# Patient Record
Sex: Male | Born: 1955 | Race: White | Hispanic: No | Marital: Married | Smoking: Former smoker
Health system: Southern US, Community
[De-identification: ages and names within clinical notes are randomized; demographics above are authoritative.]

## PROBLEM LIST (undated history)

## (undated) DIAGNOSIS — F419 Anxiety disorder, unspecified: Secondary | ICD-10-CM

## (undated) DIAGNOSIS — M1990 Unspecified osteoarthritis, unspecified site: Secondary | ICD-10-CM

## (undated) DIAGNOSIS — H409 Unspecified glaucoma: Secondary | ICD-10-CM

## (undated) DIAGNOSIS — E7800 Pure hypercholesterolemia, unspecified: Secondary | ICD-10-CM

## (undated) DIAGNOSIS — G1221 Amyotrophic lateral sclerosis: Secondary | ICD-10-CM

## (undated) DIAGNOSIS — K635 Polyp of colon: Secondary | ICD-10-CM

## (undated) DIAGNOSIS — I10 Essential (primary) hypertension: Secondary | ICD-10-CM

## (undated) DIAGNOSIS — K219 Gastro-esophageal reflux disease without esophagitis: Secondary | ICD-10-CM

## (undated) HISTORY — DX: Anxiety disorder, unspecified: F41.9

## (undated) HISTORY — DX: Unspecified osteoarthritis, unspecified site: M19.90

## (undated) HISTORY — PX: GASTRECTOMY: SHX58

## (undated) HISTORY — DX: Pure hypercholesterolemia, unspecified: E78.00

## (undated) HISTORY — DX: Essential (primary) hypertension: I10

## (undated) HISTORY — PX: COLONOSCOPY: SHX174

## (undated) HISTORY — DX: Polyp of colon: K63.5

## (undated) HISTORY — DX: Gastro-esophageal reflux disease without esophagitis: K21.9

## (undated) HISTORY — DX: Unspecified glaucoma: H40.9

---

## 1998-08-12 ENCOUNTER — Emergency Department (HOSPITAL_COMMUNITY): Admission: EM | Admit: 1998-08-12 | Discharge: 1998-08-12 | Payer: Self-pay | Admitting: Emergency Medicine

## 1998-09-19 ENCOUNTER — Encounter: Payer: Self-pay | Admitting: Emergency Medicine

## 1998-09-19 ENCOUNTER — Emergency Department (HOSPITAL_COMMUNITY): Admission: EM | Admit: 1998-09-19 | Discharge: 1998-09-19 | Payer: Self-pay | Admitting: Emergency Medicine

## 2003-11-11 ENCOUNTER — Observation Stay (HOSPITAL_COMMUNITY): Admission: EM | Admit: 2003-11-11 | Discharge: 2003-11-12 | Payer: Self-pay | Admitting: Emergency Medicine

## 2004-03-09 HISTORY — PX: OTHER SURGICAL HISTORY: SHX169

## 2004-11-17 ENCOUNTER — Emergency Department (HOSPITAL_COMMUNITY): Admission: EM | Admit: 2004-11-17 | Discharge: 2004-11-17 | Payer: Self-pay | Admitting: Emergency Medicine

## 2011-07-02 ENCOUNTER — Ambulatory Visit: Payer: Self-pay | Admitting: Family

## 2011-07-14 ENCOUNTER — Ambulatory Visit: Payer: Self-pay | Admitting: Family

## 2011-07-14 LAB — CREATININE, SERUM
Creatinine: 0.99 mg/dL (ref 0.60–1.30)
EGFR (African American): >60
EGFR (Non-African Amer.): >60

## 2012-12-13 ENCOUNTER — Encounter: Payer: Self-pay | Admitting: Internal Medicine

## 2012-12-21 ENCOUNTER — Ambulatory Visit (AMBULATORY_SURGERY_CENTER): Payer: BC Managed Care – PPO

## 2012-12-21 VITALS — Ht 69.5 in | Wt 170.0 lb

## 2012-12-21 DIAGNOSIS — Z1211 Encounter for screening for malignant neoplasm of colon: Secondary | ICD-10-CM

## 2012-12-21 MED ORDER — MOVIPREP 100 G PO SOLR: 1.0000 | Freq: Once | ORAL | Status: DC

## 2012-12-22 ENCOUNTER — Encounter: Payer: Self-pay | Admitting: Internal Medicine

## 2013-01-02 ENCOUNTER — Encounter: Payer: Self-pay | Admitting: Internal Medicine

## 2013-02-07 NOTE — Addendum Note (Signed)
Addended by: Maple Hudson on: 02/07/2013 12:25 PM   Modules accepted: Level of Service

## 2013-04-03 ENCOUNTER — Telehealth: Payer: Self-pay | Admitting: Internal Medicine

## 2013-04-03 NOTE — Telephone Encounter (Signed)
Rx for MoviPrep called in to [facility].  Pt's wife notified.

## 2013-04-06 ENCOUNTER — Ambulatory Visit (AMBULATORY_SURGERY_CENTER): Payer: BC Managed Care – PPO | Admitting: Internal Medicine

## 2013-04-06 ENCOUNTER — Encounter: Payer: Self-pay | Admitting: Internal Medicine

## 2013-04-06 VITALS — BP 124/76 | HR 60 | Temp 96.0°F | Resp 38 | Ht 69.5 in | Wt 170.0 lb

## 2013-04-06 DIAGNOSIS — D126 Benign neoplasm of colon, unspecified: Secondary | ICD-10-CM

## 2013-04-06 DIAGNOSIS — Z1211 Encounter for screening for malignant neoplasm of colon: Secondary | ICD-10-CM

## 2013-04-06 MED ORDER — SODIUM CHLORIDE 0.9 % IV SOLN: 500.0000 mL | INTRAVENOUS | Status: DC

## 2013-04-06 NOTE — Patient Instructions (Signed)
YOU HAD AN ENDOSCOPIC PROCEDURE TODAY AT THE [facility]: Refer to the procedure report that was given to you for any specific questions about what was found during the examination.  If the procedure report does not answer your questions, please call your gastroenterologist to clarify.  If you requested that your care partner not be given the details of your procedure findings, then the procedure report has been included in a sealed envelope for you to review at your convenience later.  YOU SHOULD EXPECT: Some feelings of bloating in the abdomen. Passage of more gas than usual.  Walking can help get rid of the air that was put into your GI tract during the procedure and reduce the bloating. If you had a lower endoscopy (such as a colonoscopy or flexible sigmoidoscopy) you may notice spotting of blood in your stool or on the toilet paper. If you underwent a bowel prep for your procedure, then you may not have a normal bowel movement for a few days.  DIET: Your first meal following the procedure should be a light meal and then it is ok to progress to your normal diet.  A half-sandwich or bowl of soup is an example of a good first meal.  Heavy or fried foods are harder to digest and may make you feel nauseous or bloated.  Likewise meals heavy in dairy and vegetables can cause extra gas to form and this can also increase the bloating.  Drink plenty of fluids but you should avoid alcoholic beverages for 24 hours.  ACTIVITY: Your care partner should take you home directly after the procedure.  You should plan to take it easy, moving slowly for the rest of the day.  You can resume normal activity the day after the procedure however you should NOT DRIVE or use heavy machinery for 24 hours (because of the sedation medicines used during the test).    SYMPTOMS TO REPORT IMMEDIATELY: A gastroenterologist can be reached at any hour.  During normal business hours, 8:30 AM to 5:00 PM Monday through Friday,  call [phone].  After hours and on weekends, please call the GI answering service at [phone] who will take a message and have the physician on call contact you.   Following lower endoscopy (colonoscopy or flexible sigmoidoscopy):  Excessive amounts of blood in the stool  Significant tenderness or worsening of abdominal pains  Swelling of the abdomen that is new, acute  Fever of 100F or higher  FOLLOW UP: If any biopsies were taken you will be contacted by phone or by letter within the next 1-3 weeks.  Call your gastroenterologist if you have not heard about the biopsies in 3 weeks.  Our staff will call the home number listed on your records the next business day following your procedure to check on you and address any questions or concerns that you may have at that time regarding the information given to you following your procedure. This is a courtesy call and so if there is no answer at the home number and we have not heard from you through the emergency physician on call, we will assume that you have returned to your regular daily activities without incident.  SIGNATURES/CONFIDENTIALITY: You and/or your care partner have signed paperwork which will be entered into your electronic medical record.  These signatures attest to the fact that that the information above on your After Visit Summary has been reviewed and is understood.  Full responsibility of the confidentiality of this   discharge information lies with you and/or your care-partner.  Please hold aspirin, all aspirin products, and all anti inflammatory medicines like motrin, advil, aleve, goody;s for 1 week. You may take tylenol only for pain. Resume these other medicines on April 13, 2013.  Handout on polyps

## 2013-04-06 NOTE — Op Note (Signed)
[place]  [address] [place], [id number]   COLONOSCOPY PROCEDURE REPORT  PATIENT: Anthony, Marquez  MR#: [id number] BIRTHDATE: December 19, 1955 , 57  yrs. old GENDER: Male ENDOSCOPIST: Jerene Bears, MD REFERRED PP:IRJJOA Wells, N.P. PROCEDURE DATE:  04/06/2013 PROCEDURE:   Colonoscopy with snare polypectomy and Colonoscopy with cold biopsy polypectomy First Screening Colonoscopy - Avg.  risk and is 50 yrs.  old or older Yes.  Prior Negative Screening - Now for repeat screening. N/A  History of Adenoma - Now for follow-up colonoscopy & has been > or = to 3 yrs.  N/A  Polyps Removed Today? Yes. ASA CLASS:   Class II INDICATIONS:average risk screening and first colonoscopy. MEDICATIONS: MAC sedation, administered by CRNA and propofol (Diprivan) 350mg  IV  DESCRIPTION OF PROCEDURE:   After the risks benefits and alternatives of the procedure were thoroughly explained, informed consent was obtained.  A digital rectal exam revealed no rectal mass.   The LB [id number]  endoscope was introduced through the anus and advanced to the cecum, which was identified by both the appendix and ileocecal valve. No adverse events experienced. The quality of the prep was good, using MoviPrep  The instrument was then slowly withdrawn as the colon was fully examined.  COLON FINDINGS: Four sessile polyps measuring 4-7 mm in size were found in the ascending colon, transverse colon (2), and sigmoid colon.  Polypectomy was performed with cold forceps (1) and using cold snare (3).  All resections were complete and all polyp tissue was completely retrieved.   A sessile polyp measuring 5 mm in size was found in the rectosigmoid colon.  A polypectomy was performed with a cold snare.  The resection was complete and the polyp tissue was completely retrieved.  Retroflexed views revealed internal hemorrhoids. The time to cecum=2 minutes 59 seconds.  Withdrawal time=21 minutes 18 seconds.   The scope was withdrawn and the procedure completed. COMPLICATIONS: There were no complications.  ENDOSCOPIC IMPRESSION: 1.   Four sessile polyps measuring 4-7 mm in size were found in the ascending colon, transverse colon, and sigmoid colon; Polypectomy was performed with cold forceps and using cold snare 2.   Sessile polyp measuring 5 mm in size was found in the rectosigmoid colon; polypectomy was performed with a cold snare  RECOMMENDATIONS: 1.  Hold aspirin, aspirin products, and anti-inflammatory medication for 1 week. 2.  Await pathology results 3.  Timing of repeat colonoscopy will be determined by pathology findings. 4.  You will receive a letter within 1-2 weeks with the results of your biopsy as well as final recommendations.  Please call my office if you have not received a letter after 3 weeks.   eSigned:  Jerene Bears, MD 04/06/2013 9:42 AM cc: The Patient; Suzan Garibaldi, N.P.

## 2013-04-06 NOTE — Progress Notes (Signed)
Patient did not experience any of the following events: a burn prior to discharge; a fall within the facility; wrong site/side/patient/procedure/implant event; or a hospital transfer or hospital admission upon discharge from the facility. (G8907) Patient did not have preoperative order for IV antibiotic SSI prophylaxis. (G8918)  

## 2013-04-06 NOTE — Progress Notes (Signed)
Called to room to assist during endoscopic procedure.  Patient ID and intended procedure confirmed with present staff. Received instructions for my participation in the procedure from the performing physician.  

## 2013-04-07 ENCOUNTER — Telehealth: Payer: Self-pay

## 2013-04-07 NOTE — Telephone Encounter (Signed)
Left message on answering machine. 

## 2013-04-12 ENCOUNTER — Encounter: Payer: Self-pay | Admitting: Internal Medicine

## 2013-12-18 ENCOUNTER — Encounter: Payer: Self-pay | Admitting: Internal Medicine

## 2014-01-29 ENCOUNTER — Ambulatory Visit (INDEPENDENT_AMBULATORY_CARE_PROVIDER_SITE_OTHER): Payer: BC Managed Care – PPO | Admitting: Diagnostic Neuroimaging

## 2014-01-29 ENCOUNTER — Encounter: Payer: Self-pay | Admitting: Diagnostic Neuroimaging

## 2014-01-29 VITALS — BP 127/92 | HR 73 | Temp 96.9°F | Ht 69.0 in | Wt 150.0 lb

## 2014-01-29 DIAGNOSIS — M6281 Muscle weakness (generalized): Secondary | ICD-10-CM

## 2014-01-29 DIAGNOSIS — M6250 Muscle wasting and atrophy, not elsewhere classified, unspecified site: Secondary | ICD-10-CM

## 2014-01-29 DIAGNOSIS — R253 Fasciculation: Secondary | ICD-10-CM

## 2014-01-29 DIAGNOSIS — R1310 Dysphagia, unspecified: Secondary | ICD-10-CM

## 2014-01-29 DIAGNOSIS — R292 Abnormal reflex: Secondary | ICD-10-CM

## 2014-01-29 DIAGNOSIS — R471 Dysarthria and anarthria: Secondary | ICD-10-CM
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
## 2014-01-29 NOTE — Patient Instructions (Signed)
I will check EMG/NCS (electrical nerve test).

## 2014-02-14 ENCOUNTER — Encounter (INDEPENDENT_AMBULATORY_CARE_PROVIDER_SITE_OTHER): Payer: Self-pay | Admitting: Diagnostic Neuroimaging

## 2014-02-14 ENCOUNTER — Ambulatory Visit (INDEPENDENT_AMBULATORY_CARE_PROVIDER_SITE_OTHER): Payer: BC Managed Care – PPO | Admitting: Diagnostic Neuroimaging

## 2014-02-14 DIAGNOSIS — R253 Fasciculation: Secondary | ICD-10-CM

## 2014-02-14 DIAGNOSIS — R471 Dysarthria and anarthria: Secondary | ICD-10-CM

## 2014-02-14 DIAGNOSIS — R1310 Dysphagia, unspecified: Secondary | ICD-10-CM

## 2014-02-14 DIAGNOSIS — M6250 Muscle wasting and atrophy, not elsewhere classified, unspecified site: Secondary | ICD-10-CM

## 2014-02-14 DIAGNOSIS — M6281 Muscle weakness (generalized): Secondary | ICD-10-CM

## 2014-02-14 DIAGNOSIS — R292 Abnormal reflex: Secondary | ICD-10-CM

## 2014-02-14 DIAGNOSIS — Z0289 Encounter for other administrative examinations: Secondary | ICD-10-CM

## 2014-02-16 ENCOUNTER — Ambulatory Visit (INDEPENDENT_AMBULATORY_CARE_PROVIDER_SITE_OTHER): Payer: BC Managed Care – PPO | Admitting: Internal Medicine

## 2014-02-16 ENCOUNTER — Encounter: Payer: Self-pay | Admitting: Internal Medicine

## 2014-02-16 VITALS — BP 126/76 | HR 80 | Ht 68.5 in | Wt 158.2 lb

## 2014-02-16 DIAGNOSIS — R1314 Dysphagia, pharyngoesophageal phase: Secondary | ICD-10-CM

## 2014-02-16 DIAGNOSIS — R1310 Dysphagia, unspecified: Secondary | ICD-10-CM

## 2014-02-16 DIAGNOSIS — R1313 Dysphagia, pharyngeal phase: Secondary | ICD-10-CM

## 2014-02-16 DIAGNOSIS — Z86010 Personal history of colonic polyps: Secondary | ICD-10-CM | POA: Insufficient documentation

## 2014-02-16 DIAGNOSIS — R1319 Other dysphagia: Secondary | ICD-10-CM

## 2014-02-16 DIAGNOSIS — G1229 Other motor neuron disease: Secondary | ICD-10-CM

## 2014-02-16 DIAGNOSIS — G1221 Amyotrophic lateral sclerosis: Secondary | ICD-10-CM

## 2014-02-16 NOTE — Progress Notes (Signed)
Patient ID: Anthony Marquez, male   DOB: December 09, 1955, 58 y.o.   MRN: [id number] HPI: Anthony Marquez is a 58 yo male known to me from screening colonoscopy earlier this year where he was found to have colon polyps which were removed (5 polyps removed in total down to be tubular adenoma, sessile serrated adenoma, and hyperplastic polyp) who is seen in consultation at the request of Suzan Garibaldi, FNP to evaluate trouble swallowing. He's here today with his son. He reports over the last year having developed trouble swallowing. Initially this felt like a tickling and itching in his throat. He was treated for oral thrush which may have helped initially but symptoms progressed. Symptoms progressed to involve neck weakness, arm weakness, slurred speech and eventually neurology workup. He is undergoing neurology evaluation at present and there is concerned that he may have ALS. He has lost 20 pounds since January and over 50 pounds in the last 3-4 years. He feels that this is mostly muscle mass as he used to be able to lift weights frequently. He notes issues with both solid and liquid food dysphagia, but solid greater than liquid. He's had no heartburn but he has been on omeprazole 20 mg daily. He does endorse occasional coughing with eating. No odynophagia. No nausea or vomiting or abdominal pain. Bowel movements a been regular without blood or melena. He has a pending MRI ordered by neurology.  Family history notable for colon polyps, liver disease and liver cancer in his brother felt secondary to alcohol. He does drink alcohol but approximately one drink per day or less  Past Medical History  Diagnosis Date  . Arthritis     back  . Hypercholesterolemia   . Glaucoma   . Hypertension   . Anxiety   . Colon polyp     Past Surgical History  Procedure Laterality Date  . Right thumb  2006    Outpatient Prescriptions Prior to Visit  Medication Sig Dispense Refill  . dorzolamide-timolol (COSOPT) 22.3-6.8 MG/ML  ophthalmic solution   1  . escitalopram (LEXAPRO) 20 MG tablet Take 10 mg by mouth 2 (two) times daily.    Marland Kitchen HYDROcodone-acetaminophen (NORCO/VICODIN) 5-325 MG per tablet Take 1 tablet by mouth as needed.  0  . latanoprost (XALATAN) 0.005 % ophthalmic solution   2  . levocetirizine (XYZAL) 5 MG tablet Take 1 tablet by mouth daily.  0  . omeprazole (PRILOSEC) 20 MG capsule Take 20 mg by mouth daily.     No facility-administered medications prior to visit.    No Known Allergies  Family History  Problem Relation Age of Onset  . Liver cancer Brother   . Colon cancer Neg Hx   . Pancreatic cancer Neg Hx   . Stomach cancer Neg Hx   . Esophageal cancer Neg Hx   . Liver disease Father   . Breast cancer Mother   . Diabetes Brother   . Gallbladder disease Neg Hx     History  Substance Use Topics  . Smoking status: Former Smoker    Types: Cigarettes  . Smokeless tobacco: Never Used  . Alcohol Use: 3.6 oz/week    6 Cans of beer per week     Comment: Occassionally    ROS: As per history of present illness, otherwise negative  BP 126/76 mmHg  Pulse 80  Ht 5' 8.5" (1.74 m)  Wt 158 lb 4 oz (71.782 kg)  BMI 23.71 kg/m2 Constitutional: Well-developed and well-nourished. No distress. HEENT: Normocephalic and atraumatic.  Oropharynx is moist with whitish discoloration of the tongue. No oropharyngeal exudate or white plaques on buccal mucosa. Conjunctivae are normal.  No scleral icterus. Neck: Neck supple. Trachea midline. Cardiovascular: Normal rate, regular rhythm and intact distal pulses. No M/R/G Pulmonary/chest: Effort normal and breath sounds normal. No wheezing, rales or rhonchi. Abdominal: Soft, nontender, nondistended. Bowel sounds active throughout. There are no masses palpable. No hepatosplenomegaly. Extremities: no clubbing, cyanosis, or edema Lymphadenopathy: No cervical adenopathy noted. Neurological: Alert and oriented to person place and time. Slurred speech and thick  speech Skin: Skin is warm and dry. No rashes noted. Psychiatric: Normal mood and affect. Behavior is normal.  RELEVANT LABS AND IMAGING: CBC    Component Value Date/Time   WBC 8.1 02/14/2014 1022   RBC 4.85 02/14/2014 1022   HGB 15.0 02/14/2014 1022   HCT 45.3 02/14/2014 1022   PLT 268 02/14/2014 1022   MCV 93 02/14/2014 1022   MCH 30.9 02/14/2014 1022   MCHC 33.1 02/14/2014 1022   RDW 13.5 02/14/2014 1022   LYMPHSABS 3.2* 02/14/2014 1022   EOSABS 0.1 02/14/2014 1022   BASOSABS 0.0 02/14/2014 1022    CMP     Component Value Date/Time   NA 143 02/14/2014 1022   K 4.6 02/14/2014 1022   CL 101 02/14/2014 1022   CO2 28 02/14/2014 1022   GLUCOSE 88 02/14/2014 1022   BUN 11 02/14/2014 1022   CREATININE 0.73* 02/14/2014 1022   CALCIUM 9.8 02/14/2014 1022   PROT 6.5 02/14/2014 1022   AST 18 02/14/2014 1022   ALT 18 02/14/2014 1022   ALKPHOS 66 02/14/2014 1022   BILITOT 0.6 02/14/2014 1022   GFRNONAA 103 02/14/2014 1022   GFRAA 119 02/14/2014 1022    ASSESSMENT/PLAN: 58 yo male known to me from screening colonoscopy earlier this year where he was found to have colon polyps which were removed (5 polyps removed in total down to be tubular adenoma, sessile serrated adenoma, and hyperplastic polyp) who is seen in consultation at the request of Suzan Garibaldi, FNP to evaluate trouble swallowing.   1. Dysphagia/wt loss -- my suspicion is that his trouble swallowing is related to his neurologic condition/motor neuron disease concerning for ALS though no definitive diagnosis reached yet. Based on his symptoms I expect there is a large oropharyngeal component to his dysphagia and for this reason I have recommended a modified barium swallow with speech pathology (FEES).  After this we will evaluate esophageal body function with barium esophagram with tablet. Finally upper endoscopy for direct visualization and to exclude thrush or other inflammatory condition of the esophagus. My suspicion  for a malignant process within the upper GI tract is low. We discussed EGD including the risks and benefits and he is agreeable to proceed. For now he will continue omeprazole 20 mg daily

## 2014-02-16 NOTE — Patient Instructions (Signed)
You have been scheduled for an endoscopy. Please follow written instructions given to you at your visit today. If you use inhalers (even only as needed), please bring them with you on the day of your procedure. Your physician has requested that you go to www.startemmi.com and enter the access code given to you at your visit today. This web site gives a general overview about your procedure. However, you should still follow specific instructions given to you by our office regarding your preparation for the procedure.   You have been scheduled for a Modified Barium Esophogram at [facility] (1st floor of the hospital) on 03/05/14 at 1:00pm. Please arrive 15 minutes prior to your appointment for registration.  If you need to reschedule for any reason, please contact radiology at [phone] to do so.  You have been scheduled for a Barium Esophogram at [facility] (1st floor of the hospital) on 03/06/2014 at 10:30am. Please arrive 15 minutes prior to your appointment for registration. Make certain not to have anything to eat or drink 3 hours prior to your test. If you need to reschedule for any reason, please contact radiology at [phone] to do so. __________________________________________________________________ A barium swallow is an examination that concentrates on views of the esophagus. This tends to be a double contrast exam (barium and two liquids which, when combined, create a gas to distend the wall of the oesophagus) or single contrast (non-ionic iodine based). The study is usually tailored to your symptoms so a good history is essential. Attention is paid during the study to the form, structure and configuration of the esophagus, looking for functional disorders (such as aspiration, dysphagia, achalasia, motility and reflux) EXAMINATION You may be asked to change into a gown, depending on the type of swallow being performed. A radiologist and radiographer will perform the  procedure. The radiologist will advise you of the type of contrast selected for your procedure and direct you during the exam. You will be asked to stand, sit or lie in several different positions and to hold a small amount of fluid in your mouth before being asked to swallow while the imaging is performed .In some instances you may be asked to swallow barium coated marshmallows to assess the motility of a solid food bolus. The exam can be recorded as a digital or video fluoroscopy procedure. POST PROCEDURE It will take 1-2 days for the barium to pass through your system. To facilitate this, it is important, unless otherwise directed, to increase your fluids for the next 24-48hrs and to resume your normal diet.  This test typically takes about 30 minutes to perform. __________________________________________________________________________________

## 2014-02-20 LAB — IFE AND PE, SERUM
ALBUMIN SERPL ELPH-MCNC: 4.2 g/dL (ref 3.2–5.6)
ALBUMIN/GLOB SERPL: 2 (ref 0.7–2.0)
ALPHA 1: 0.2 g/dL (ref 0.1–0.4)
Alpha2 Glob SerPl Elph-Mcnc: 0.6 g/dL (ref 0.4–1.2)
B-Globulin SerPl Elph-Mcnc: 0.8 g/dL (ref 0.6–1.3)
GAMMA GLOB SERPL ELPH-MCNC: 0.6 g/dL (ref 0.5–1.6)
GLOBULIN, TOTAL: 2.2 g/dL (ref 2.0–4.5)
IGA/IMMUNOGLOBULIN A, SERUM: 124 mg/dL (ref 91–414)
IGG (IMMUNOGLOBIN G), SERUM: 673 mg/dL — AB (ref 700–1600)
IgM (Immunoglobulin M), Srm: 40 mg/dL (ref 40–230)
Total Protein: 6.4 g/dL (ref 6.0–8.5)

## 2014-02-20 LAB — SPECIMEN STATUS REPORT

## 2014-02-20 NOTE — Procedures (Signed)
   [facility]  NCS (NERVE CONDUCTION STUDY) WITH EMG (ELECTROMYOGRAPHY) REPORT   STUDY DATE: 02/14/14  PATIENT NAME: Anthony Marquez DOB: [birth year] MRN: [id number]  ORDERING CLINICIAN: Andrey Spearman, MD   TECHNOLOGIST: Laretta Alstrom ELECTROMYOGRAPHER: Earlean Polka. Fredy Gladu, MD  CLINICAL INFORMATION: 58 year old male with muscle weakness, muscle atrophy, fasciculations. Evaluate for motor neuron disease.  FINDINGS: NERVE CONDUCTION STUDY: Right median motor response has mildly prolonged distal latency, normal empty, normal response and normal F-wave latency. Right ulnar, bilateral peroneal and right tibial motor responses and F wave latencies are normal. Left tibial motor response has borderline prolonged distal latency, normal amplitude, normal conduction velocity and normal F-wave latency. Right median, right ulnar, bilateral peroneal sensory responses are normal.  NEEDLE ELECTROMYOGRAPHY: Needle examination of right upper extremity, right lower extremity, cervical, thoracic, lumbar paraspinal muscles, and right genioglossus demonstrates: R.deltoid - no abnormal spontaneous activity at rest; decreased motor unit recruitment on exertion R.triceps - no abnormal spontaneous activity at rest; decreased motor unit recruitment on exertion R.biceps - 2+ spontaneous activity at rest; decreased motor unit recruitment on exertion R.flexor carpi radialis - 1+ spontaneous activity at rest; decreased motor unit recruitment on exertion R.first dorsal interosseous - 1+ spontaneous activity at rest; decreased motor unit recruitment on exertion  R.gluteus medius - 2+ spontaneous activity at rest; decreased motor unit recruitment on exertion R.iliopsoas - no abnormal spontaneous activity at rest; normal motor unit recruitment on exertion R.vastuis medialis - no abnormal spontaneous activity at rest; normal motor unit recruitment on exertion R.tibialis anterior - 1+ spontaneous  activity at rest; decreased motor unit recruitment on exertion R.gastrocnemius - 1+ spontaneous activity (fasciculations and fibrillation potentials) at rest; decreased motor unit recruitment on exertion  R.C5-6 - no abnormal spontaneous activity R.C6-7 - no abnormal spontaneous activity L.T6-7 - no abnormal spontaneous activity R.T7-8 - no abnormal spontaneous activity R.L4-5 - no abnormal spontaneous activity R.L5-S1 - no abnormal spontaneous activity  R.genioglossus (submental approach) - no abnormal spontaneous activity at rest; decreased motor unit recruitment on exertion   IMPRESSION:  Abnormal study demonstrating: - There are widespread acute and chronic denervation changes in the bulbar, cervical and lumbar segments. Mild prolonged distal latencies in the right median and left tibial motor nerves. Findings suggest a disorder of the motor nerves and/or their axons.     INTERPRETING PHYSICIAN:  Penni Bombard, MD Certified in Neurology, Neurophysiology and Neuroimaging  [facility] [address] [place], [place] [address] [phone]

## 2014-02-21 ENCOUNTER — Other Ambulatory Visit: Payer: Self-pay | Admitting: Diagnostic Neuroimaging

## 2014-02-21 ENCOUNTER — Ambulatory Visit (AMBULATORY_SURGERY_CENTER): Payer: BC Managed Care – PPO | Admitting: Internal Medicine

## 2014-02-21 ENCOUNTER — Encounter: Payer: Self-pay | Admitting: Internal Medicine

## 2014-02-21 ENCOUNTER — Encounter: Payer: Self-pay | Admitting: Diagnostic Neuroimaging

## 2014-02-21 VITALS — BP 134/71 | HR 67 | Temp 97.6°F | Resp 23 | Ht 68.5 in | Wt 158.0 lb

## 2014-02-21 DIAGNOSIS — R1313 Dysphagia, pharyngeal phase: Secondary | ICD-10-CM

## 2014-02-21 DIAGNOSIS — K2970 Gastritis, unspecified, without bleeding: Secondary | ICD-10-CM

## 2014-02-21 DIAGNOSIS — R1314 Dysphagia, pharyngoesophageal phase: Secondary | ICD-10-CM

## 2014-02-21 DIAGNOSIS — R531 Weakness: Secondary | ICD-10-CM

## 2014-02-21 DIAGNOSIS — K2950 Unspecified chronic gastritis without bleeding: Secondary | ICD-10-CM

## 2014-02-21 DIAGNOSIS — R634 Abnormal weight loss: Secondary | ICD-10-CM

## 2014-02-21 LAB — HEAVY METALS SCREEN, URINE
ARSENIC(INORGANIC), U: NOT DETECTED ug/L (ref 0–19)
Arsenic Ur: NOT DETECTED ug/L (ref 0–50)
Creatinine(Crt),U: 0.81 g/L (ref 0.30–3.00)
Lead, Rand Ur: NOT DETECTED ug/L (ref 0–49)
Mercury, Ur: NOT DETECTED ug/L (ref 0–19)

## 2014-02-21 MED ORDER — SODIUM CHLORIDE 0.9 % IV SOLN: 500.0000 mL | INTRAVENOUS | Status: DC

## 2014-02-21 NOTE — Progress Notes (Signed)
Called to room to assist during endoscopic procedure.  Patient ID and intended procedure confirmed with present staff. Received instructions for my participation in the procedure from the performing physician.  

## 2014-02-21 NOTE — Patient Instructions (Signed)
Stomach biopsies taken today. Gastritis handout given. Call us with any questions or concerns. Thank you!  YOU HAD AN ENDOSCOPIC PROCEDURE TODAY AT [facility]: Refer to the procedure report that was given to you for any specific questions about what was found during the examination.  If the procedure report does not answer your questions, please call your gastroenterologist to clarify.  If you requested that your care partner not be given the details of your procedure findings, then the procedure report has been included in a sealed envelope for you to review at your convenience later.  YOU SHOULD EXPECT: Some feelings of bloating in the abdomen. Passage of more gas than usual.  Walking can help get rid of the air that was put into your GI tract during the procedure and reduce the bloating. If you had a lower endoscopy (such as a colonoscopy or flexible sigmoidoscopy) you may notice spotting of blood in your stool or on the toilet paper. If you underwent a bowel prep for your procedure, then you may not have a normal bowel movement for a few days.  DIET: Your first meal following the procedure should be a light meal and then it is ok to progress to your normal diet.  A half-sandwich or bowl of soup is an example of a good first meal.  Heavy or fried foods are harder to digest and may make you feel nauseous or bloated.  Likewise meals heavy in dairy and vegetables can cause extra gas to form and this can also increase the bloating.  Drink plenty of fluids but you should avoid alcoholic beverages for 24 hours.  ACTIVITY: Your care partner should take you home directly after the procedure.  You should plan to take it easy, moving slowly for the rest of the day.  You can resume normal activity the day after the procedure however you should NOT DRIVE or use heavy machinery for 24 hours (because of the sedation medicines used during the test).    SYMPTOMS TO REPORT IMMEDIATELY: A  gastroenterologist can be reached at any hour.  During normal business hours, 8:30 AM to 5:00 PM Monday through Friday, call [phone].  After hours and on weekends, please call the GI answering service at [phone] who will take a message and have the physician on call contact you.   Following lower endoscopy (colonoscopy or flexible sigmoidoscopy):  Excessive amounts of blood in the stool  Significant tenderness or worsening of abdominal pains  Swelling of the abdomen that is new, acute  Fever of 100F or higher  Following upper endoscopy (EGD)  Vomiting of blood or coffee ground material  New chest pain or pain under the shoulder blades  Painful or persistently difficult swallowing  New shortness of breath  Fever of 100F or higher  Black, tarry-looking stools  FOLLOW UP: If any biopsies were taken you will be contacted by phone or by letter within the next 1-3 weeks.  Call your gastroenterologist if you have not heard about the biopsies in 3 weeks.  Our staff will call the home number listed on your records the next business day following your procedure to check on you and address any questions or concerns that you may have at that time regarding the information given to you following your procedure. This is a courtesy call and so if there is no answer at the home number and we have not heard from you through the emergency physician on call, we will assume  that you have returned to your regular daily activities without incident.  SIGNATURES/CONFIDENTIALITY: You and/or your care partner have signed paperwork which will be entered into your electronic medical record.  These signatures attest to the fact that that the information above on your After Visit Summary has been reviewed and is understood.  Full responsibility of the confidentiality of this discharge information lies with you and/or your care-partner.

## 2014-02-21 NOTE — Progress Notes (Signed)
Stable to RR 

## 2014-02-21 NOTE — Op Note (Signed)
[facility]  [address] [place], [id number]   ENDOSCOPY PROCEDURE REPORT  PATIENT: Anthony Marquez, Anthony Marquez  MR#: [id number] BIRTHDATE: [birth year] , 82  yrs. old GENDER: male ENDOSCOPIST: Jerene Bears, MD REFERRED BY:  Suzan Garibaldi, N.P. PROCEDURE DATE:  02/21/2014 PROCEDURE:  EGD w/ biopsy ASA CLASS:     Class II INDICATIONS:  dysphagia and weight loss. MEDICATIONS: Monitored anesthesia care, Propofol 100 mg IV, and lidocaine 40 mg IV TOPICAL ANESTHETIC: none  DESCRIPTION OF PROCEDURE: After the risks benefits and alternatives of the procedure were thoroughly explained, informed consent was obtained.  The LB [id number] endoscope was introduced through the mouth and advanced to the second portion of the duodenum , Without limitations.  The instrument was slowly withdrawn as the mucosa was fully examined.   ESOPHAGUS: The mucosa of the esophagus appeared normal.  No esophagitis, stricture or mass lesion seen.  STOMACH: Moderate gastritis (inflammation) was found in the gastric antrum and gastric body.  Cold forcep biopsies were taken at the gastric body, antrum and angularis to evaluate for h.  pylori.  DUODENUM: The duodenal mucosa showed no abnormalities in the bulb and 2nd part of the duodenum. Retroflexed views revealed no abnormalities.     The scope was then withdrawn from the patient and the procedure completed.  COMPLICATIONS: There were no immediate complications.  ENDOSCOPIC IMPRESSION: 1.   The mucosa of the esophagus appeared normal 2.   Gastritis (inflammation) was found in the gastric antrum and gastric body 3.   The duodenal mucosa showed no abnormalities in the bulb and 2nd part of the duodenum  RECOMMENDATIONS: 1.  Await biopsy results 2.  Await finding of ongoing neurology evaluation and speech and swallow pathology examination with barium swallow   eSigned:  Jerene Bears, MD 02/21/2014 1:55 PMn revised  CC:The Patient, PCP,   Andrey Spearman, MD

## 2014-02-22 ENCOUNTER — Ambulatory Visit: Payer: BC Managed Care – PPO

## 2014-02-22 ENCOUNTER — Telehealth: Payer: Self-pay | Admitting: *Deleted

## 2014-02-22 LAB — COMPREHENSIVE METABOLIC PANEL
A/G RATIO: 2.4 (ref 1.1–2.5)
ALBUMIN: 4.6 g/dL (ref 3.5–5.5)
ALK PHOS: 66 IU/L (ref 39–117)
ALT: 18 IU/L (ref 0–44)
AST: 18 IU/L (ref 0–40)
BUN/Creatinine Ratio: 15 (ref 9–20)
BUN: 11 mg/dL (ref 6–24)
CHLORIDE: 101 mmol/L (ref 97–108)
CO2: 28 mmol/L (ref 18–29)
CREATININE: 0.73 mg/dL — AB (ref 0.76–1.27)
Calcium: 9.8 mg/dL (ref 8.7–10.2)
GFR, EST AFRICAN AMERICAN: 119 mL/min/{1.73_m2} (ref >59)
GFR, EST NON AFRICAN AMERICAN: 103 mL/min/{1.73_m2} (ref >59)
Globulin, Total: 1.9 g/dL (ref 1.5–4.5)
Glucose: 88 mg/dL (ref 65–99)
Potassium: 4.6 mmol/L (ref 3.5–5.2)
SODIUM: 143 mmol/L (ref 134–144)
Total Bilirubin: 0.6 mg/dL (ref 0.0–1.2)
Total Protein: 6.5 g/dL (ref 6.0–8.5)

## 2014-02-22 LAB — ALDOLASE: Aldolase: 5.5 U/L (ref 3.3–10.3)

## 2014-02-22 LAB — ACETYLCHOLINE RECEPTOR, BINDING

## 2014-02-22 LAB — CBC WITH DIFFERENTIAL
BASOS ABS: 0 10*3/uL (ref 0.0–0.2)
BASOS: 1 %
Eos: 2 %
Eosinophils Absolute: 0.1 10*3/uL (ref 0.0–0.4)
HEMATOCRIT: 45.3 % (ref 37.5–51.0)
HEMOGLOBIN: 15 g/dL (ref 12.6–17.7)
IMMATURE GRANS (ABS): 0 10*3/uL (ref 0.0–0.1)
IMMATURE GRANULOCYTES: 0 %
LYMPHS ABS: 3.2 10*3/uL — AB (ref 0.7–3.1)
Lymphs: 40 %
MCH: 30.9 pg (ref 26.6–33.0)
MCHC: 33.1 g/dL (ref 31.5–35.7)
MCV: 93 fL (ref 79–97)
MONOCYTES: 6 %
MONOS ABS: 0.5 10*3/uL (ref 0.1–0.9)
NEUTROS ABS: 4.2 10*3/uL (ref 1.4–7.0)
NEUTROS PCT: 51 %
Platelets: 268 10*3/uL (ref 150–379)
RBC: 4.85 x10E6/uL (ref 4.14–5.80)
RDW: 13.5 % (ref 12.3–15.4)
WBC: 8.1 10*3/uL (ref 3.4–10.8)

## 2014-02-22 LAB — PROTEIN ELECTROPHORESIS, URINE REFLEX
Albumin ELP, Urine: 77.5 %
Alpha-1-Globulin, U: 4 %
Alpha-2-Globulin, U: 4.1 %
Beta Globulin, U: 10.5 %
GAMMA GLOBULIN, U: 4 %
Protein, Ur: 23.1 mg/dL — ABNORMAL HIGH (ref 0.0–15.0)

## 2014-02-22 LAB — HIV ANTIBODY (ROUTINE TESTING W REFLEX)
HIV 1/O/2 Abs-Index Value: <1 (ref <1.00)
HIV-1/HIV-2 Ab: NONREACTIVE

## 2014-02-22 LAB — GM1 ANTIBODY IGG, IGM: GM 1 IgG: <1:100 {titer}

## 2014-02-22 LAB — VITAMIN B12: Vitamin B-12: >1999 pg/mL — ABNORMAL HIGH (ref 211–946)

## 2014-02-22 LAB — HEAVY METALS SCREEN, URINE
ARSENIC(INORGANIC), U: NOT DETECTED ug/L (ref 0–19)
Arsenic Ur: NOT DETECTED ug/L (ref 0–50)
CREATININE(CRT), U: 1.12 g/L (ref 0.30–3.00)
Lead, Rand Ur: NOT DETECTED ug/L (ref 0–49)
Mercury, Ur: NOT DETECTED ug/L (ref 0–19)

## 2014-02-22 LAB — RPR QUALITATIVE: RPR: NONREACTIVE

## 2014-02-22 LAB — SEDIMENTATION RATE: SED RATE: 2 mm/h (ref 0–30)

## 2014-02-22 LAB — CK: Total CK: 83 U/L (ref 24–204)

## 2014-02-22 LAB — TSH: TSH: 1.73 u[IU]/mL (ref 0.450–4.500)

## 2014-02-22 LAB — FOLATE RBC
Folate, Hemolysate: 486.9 ng/mL
Folate, RBC: 1075 ng/mL (ref >498)

## 2014-02-22 LAB — ANA W/REFLEX: ANA: NEGATIVE

## 2014-02-22 LAB — C-REACTIVE PROTEIN: CRP: 0.8 mg/L (ref 0.0–4.9)

## 2014-02-22 NOTE — Telephone Encounter (Signed)
  Follow up Call-  Call back number 02/21/2014 04/06/2013  Post procedure Call Back phone  # [phone]  Permission to leave phone message Yes Yes   LMOM

## 2014-03-01 ENCOUNTER — Other Ambulatory Visit (HOSPITAL_COMMUNITY): Payer: Self-pay | Admitting: Internal Medicine

## 2014-03-01 DIAGNOSIS — R1310 Dysphagia, unspecified: Secondary | ICD-10-CM

## 2014-03-05 ENCOUNTER — Ambulatory Visit (HOSPITAL_COMMUNITY)
Admission: RE | Admit: 2014-03-05 | Discharge: 2014-03-05 | Disposition: A | Discharge status: Home / Self Care | Payer: BC Managed Care – PPO | Source: Ambulatory Visit | Attending: Internal Medicine | Admitting: Internal Medicine

## 2014-03-05 DIAGNOSIS — R1310 Dysphagia, unspecified: Secondary | ICD-10-CM | POA: Insufficient documentation

## 2014-03-05 DIAGNOSIS — R1313 Dysphagia, pharyngeal phase: Secondary | ICD-10-CM

## 2014-03-05 DIAGNOSIS — R1319 Other dysphagia: Secondary | ICD-10-CM

## 2014-03-05 NOTE — Procedures (Signed)
Objective Swallowing Evaluation: Modified Barium Swallowing Study  Patient Details  Name: Anthony Marquez MRN: [id number] Date of Birth: Jun 29, 1955  Today's Date: 03/05/2014 Time: 1316-1350 SLP Time Calculation (min) (ACUTE ONLY): 34 min  Past Medical History:  Past Medical History  Diagnosis Date  . Arthritis     back  . Hypercholesterolemia   . Glaucoma   . Hypertension   . Anxiety   . Colon polyp   . GERD (gastroesophageal reflux disease)    Past Surgical History:  Past Surgical History  Procedure Laterality Date  . Right thumb  2006  . Colonoscopy    . Gastrectomy     HPI:  58 year old male seen for OP MBS due to c/o worsening dysphagia and dysarthria as well as significant weight loss. Patient currently being worked up for possible ALS. EGD complete 12/16 showed moderate gastritis. Barium swallow to be complete following todays study.      Assessment / Plan / Recommendation Clinical Impression  Dysphagia Diagnosis: Mild oral phase dysphagia;Moderate oral phase dysphagia;Mild pharyngeal phase dysphagia;Moderate pharyngeal phase dysphagia Clinical impression: Patient presents with a mild-moderate oropharyngeal dyspahagia with possible esophageal component. Lingual and laryngeal weakness result in pharyngeal residuals post swallow, which with liquids are flash penetrates periodically into the airway, increasing risk for aspiration. While cueing for chin tuck assisted to decrease pharyngeal residuals however worsened airway protection with liquids. Patient compensating well for deficits with rapid multiple swallows which aid in pharyngeal clearance and airway protection although risk of aspiration remains. Cannot r/o esophageal component given what appears to be mildly reduced UES relaxation however this may also be a product of decreased anterior movement of the hyo-laryngeal complex. Education complete regarding results and recommendations with patient and spouse, Particularly if  ALS is in question, recommend OP SLP f/u for improvement and/or maintenance of function.        Diet Recommendation Regular;Thin liquid (moistened solids)   Liquid Administration via: Cup;Straw Medication Administration: Whole meds with puree Supervision: Patient able to self feed Compensations: Slow rate;Small sips/bites;Multiple dry swallows after each bite/sip Postural Changes and/or Swallow Maneuvers: Seated upright 90 degrees    Other  Recommendations Oral Care Recommendations: Oral care BID   Follow Up Recommendations  Outpatient SLP               General Date of Onset: 08/03/13 HPI: 57 year old male seen for OP MBS due to c/o worsening dysphagia and dysarthria as well as significant weight loss. Patient currently being worked up for possible ALS. EGD complete 12/16 showed moderate gastritis. Barium swallow to be complete following todays study.  Type of Study: Modified Barium Swallowing Study Reason for Referral: Objectively evaluate swallowing function Previous Swallow Assessment: none Diet Prior to this Study: Regular;Thin liquids Temperature Spikes Noted: No Respiratory Status: Room air History of Recent Intubation: No Behavior/Cognition: Alert;Cooperative;Pleasant mood Oral Cavity - Dentition: Adequate natural dentition Oral Motor / Sensory Function: Impaired motor Oral impairment: Right lingual;Left lingual Self-Feeding Abilities: Able to feed self Patient Positioning: Upright in chair Baseline Vocal Quality: Clear (dysarthric) Volitional Cough: Strong Volitional Swallow: Able to elicit Anatomy: Within functional limits Pharyngeal Secretions: Not observed secondary MBS    Reason for Referral Objectively evaluate swallowing function   Oral Phase Oral Preparation/Oral Phase Oral Phase: WFL   Pharyngeal Phase Pharyngeal Phase Pharyngeal Phase: Impaired Pharyngeal - Thin Pharyngeal - Thin Cup: Reduced anterior laryngeal mobility;Reduced tongue base  retraction;Penetration/Aspiration during swallow;Trace aspiration;Pharyngeal residue - valleculae;Pharyngeal residue - pyriform sinuses;Compensatory strategies attempted (Comment) (  aspiration noted only with chin tuck) Penetration/Aspiration details (thin cup): Material enters airway, remains ABOVE vocal cords then ejected out;Material enters airway, passes BELOW cords then ejected out Pharyngeal - Thin Straw: Reduced anterior laryngeal mobility;Reduced tongue base retraction;Penetration/Aspiration during swallow;Pharyngeal residue - valleculae;Pharyngeal residue - pyriform sinuses Penetration/Aspiration details (thin straw): Material enters airway, remains ABOVE vocal cords then ejected out Pharyngeal - Solids Pharyngeal - Puree: Reduced anterior laryngeal mobility;Reduced tongue base retraction;Pharyngeal residue - valleculae;Pharyngeal residue - pyriform sinuses Pharyngeal - Mechanical Soft: Reduced anterior laryngeal mobility;Reduced tongue base retraction;Pharyngeal residue - valleculae;Pharyngeal residue - pyriform sinuses Pharyngeal - Pill: Reduced anterior laryngeal mobility;Reduced tongue base retraction  Cervical Esophageal Phase    GO    Cervical Esophageal Phase Cervical Esophageal Phase: Impaired Cervical Esophageal Phase - Comment Cervical Esophageal Comment: ? mildly tight UES    Functional Assessment Tool Used: skilled clinical judgement Functional Limitations: Swallowing Swallow Current Status (X5170): At least 20 percent but less than 40 percent impaired, limited or restricted Swallow Goal Status [phone]): At least 20 percent but less than 40 percent impaired, limited or restricted Swallow Discharge Status [phone]): At least 20 percent but less than 40 percent impaired, limited or restricted   Anthony Marquez, [name] [phone]  Anthony Marquez Anthony Marquez 03/05/2014, 2:07 PM

## 2014-03-06 ENCOUNTER — Ambulatory Visit (HOSPITAL_COMMUNITY): Payer: BC Managed Care – PPO

## 2014-03-12 ENCOUNTER — Encounter: Payer: Self-pay | Admitting: Internal Medicine

## 2014-03-14 ENCOUNTER — Other Ambulatory Visit: Payer: Self-pay

## 2014-03-14 DIAGNOSIS — K228 Other specified diseases of esophagus: Secondary | ICD-10-CM

## 2014-03-14 DIAGNOSIS — K2289 Other specified disease of esophagus: Secondary | ICD-10-CM

## 2014-03-16 ENCOUNTER — Ambulatory Visit (INDEPENDENT_AMBULATORY_CARE_PROVIDER_SITE_OTHER)
Admission: RE | Admit: 2014-03-16 | Discharge: 2014-03-16 | Disposition: A | Discharge status: Home / Self Care | Payer: 59 | Source: Ambulatory Visit | Attending: Internal Medicine | Admitting: Internal Medicine

## 2014-03-16 DIAGNOSIS — K2289 Other specified disease of esophagus: Secondary | ICD-10-CM

## 2014-03-16 DIAGNOSIS — K229 Disease of esophagus, unspecified: Secondary | ICD-10-CM

## 2014-03-16 DIAGNOSIS — K228 Other specified diseases of esophagus: Secondary | ICD-10-CM

## 2014-03-16 MED ORDER — IOHEXOL 300 MG/ML  SOLN
80.0000 mL | Freq: Once | INTRAMUSCULAR | Status: AC | PRN
  Administered 2014-03-16: 80 mL via INTRAVENOUS

## 2014-03-28 ENCOUNTER — Telehealth: Payer: Self-pay

## 2014-04-20 ENCOUNTER — Ambulatory Visit
Admission: RE | Admit: 2014-04-20 | Discharge: 2014-04-20 | Disposition: A | Discharge status: Home / Self Care | Payer: 59 | Source: Ambulatory Visit | Attending: Diagnostic Neuroimaging | Admitting: Diagnostic Neuroimaging

## 2014-04-20 ENCOUNTER — Other Ambulatory Visit: Payer: Self-pay | Admitting: Diagnostic Neuroimaging

## 2014-04-20 ENCOUNTER — Ambulatory Visit: Admission: RE | Admit: 2014-04-20 | Payer: 59 | Source: Ambulatory Visit

## 2014-04-20 DIAGNOSIS — R4781 Slurred speech: Secondary | ICD-10-CM

## 2014-04-20 DIAGNOSIS — R531 Weakness: Secondary | ICD-10-CM

## 2014-04-20 DIAGNOSIS — M6250 Muscle wasting and atrophy, not elsewhere classified, unspecified site: Secondary | ICD-10-CM

## 2014-04-20 DIAGNOSIS — M795 Residual foreign body in soft tissue: Secondary | ICD-10-CM

## 2014-04-20 MED ORDER — GADOBENATE DIMEGLUMINE 529 MG/ML IV SOLN
14.0000 mL | Freq: Once | INTRAVENOUS | Status: AC | PRN
  Administered 2014-04-20: 14 mL via INTRAVENOUS

## 2014-04-24 ENCOUNTER — Telehealth: Payer: Self-pay | Admitting: *Deleted

## 2014-04-24 DIAGNOSIS — G1221 Amyotrophic lateral sclerosis: Secondary | ICD-10-CM

## 2014-04-24 DIAGNOSIS — I639 Cerebral infarction, unspecified: Secondary | ICD-10-CM

## 2014-04-24 NOTE — Telephone Encounter (Signed)
Wife is calling wanting MRI results. Patient just had MRI on Friday and I explained to her that we were closed on yesterday. Patient wife understands and just would like a call back.

## 2014-04-25 MED ORDER — ASPIRIN 81 MG PO TABS: 81.0000 mg | ORAL_TABLET | Freq: Every day | ORAL | Status: AC

## 2014-04-25 NOTE — Telephone Encounter (Signed)
I called patient. MRI results reviewed. Possible subacute infarction in right parietal lobe, and mild chronic small vessel ischemic disease, but these are mild and likely incidental findings. Will complete stroke workup and treatment. Start aspirin 81mg  daily. I will check carotid u/s and echocardiogram.   MRI c-spine shows some foraminal stenosis at C3-4 and C6-7. No spinal stenosis. Not enough to explain the symptoms.  Overall, based on history, exam, MRI brain and c-spine, lab result, all data now pointing towards possible ALS diagnosis. I advised a second opinion with ALS clinic (they prefer [place]). Will set this up.   Penni Bombard, MD 4/85/4627, 0:35 PM Certified in Neurology, Neurophysiology and Neuroimaging  [facility] [address], [place], [place] [address] [phone]

## 2014-04-25 NOTE — Telephone Encounter (Signed)
Pt's wife is calling back requesting MRI results.  I did inform her that we had 24-48 hours to return the call.  She would like a call back as soon as possible.

## 2014-04-26 ENCOUNTER — Telehealth: Payer: Self-pay | Admitting: Radiology

## 2014-04-26 NOTE — Telephone Encounter (Signed)
Spoke with patient regarding scheduling his Carotid doppler.  He states he is having it next week at [facility] and his primary care physician scheduled it for him.  Asked to have copy sent to Dr. Leta Baptist.

## 2014-04-26 NOTE — Telephone Encounter (Signed)
Called patient's home phone number and there was no answer.  Unable to leave a voice message.  Calling to schedule Carotid doppler.

## 2014-05-15 ENCOUNTER — Telehealth: Payer: Self-pay | Admitting: *Deleted

## 2014-05-15 NOTE — Telephone Encounter (Signed)
Patient wife calling requesting to speak to Engineer, building services. Marcie Bal was not at her desk or on another line. Patient wife did not want voice mail states she will keep calling back until she gets a hold of Cira Servant. Wife did not want to give a reason for phone call.

## 2014-05-21 ENCOUNTER — Telehealth: Payer: Self-pay | Admitting: Diagnostic Neuroimaging

## 2014-05-24 ENCOUNTER — Other Ambulatory Visit (HOSPITAL_COMMUNITY): Payer: 59

## 2014-06-06 ENCOUNTER — Telehealth: Payer: Self-pay | Admitting: *Deleted

## 2014-06-06 NOTE — Telephone Encounter (Signed)
Please fax patient ALS referral to Norva Pavlov at  [facility]  [phone] (Fax). [facility] can not get the patient in until Aug and the wife do not want to wait that long. The wife states the [facility] can get the patient in in April once all the information is received. The wife will call back later to see what the status is on this referral.

## 2014-06-06 NOTE — Telephone Encounter (Signed)
Faxed records to [facility]

## 2014-06-13 ENCOUNTER — Other Ambulatory Visit: Payer: Self-pay | Admitting: *Deleted

## 2014-06-13 DIAGNOSIS — G1221 Amyotrophic lateral sclerosis: Secondary | ICD-10-CM

## 2014-08-31 DIAGNOSIS — Z0289 Encounter for other administrative examinations: Secondary | ICD-10-CM

## 2014-10-02 NOTE — Telephone Encounter (Signed)
ERROR

## 2015-05-09 ENCOUNTER — Emergency Department (HOSPITAL_COMMUNITY)
Admission: EM | Admit: 2015-05-09 | Discharge: 2015-06-08 | Disposition: E | Payer: PPO | Attending: Emergency Medicine | Admitting: Emergency Medicine

## 2015-05-09 ENCOUNTER — Emergency Department (HOSPITAL_COMMUNITY): Payer: PPO

## 2015-05-09 ENCOUNTER — Encounter (HOSPITAL_COMMUNITY): Payer: Self-pay | Admitting: Emergency Medicine

## 2015-05-09 DIAGNOSIS — H409 Unspecified glaucoma: Secondary | ICD-10-CM | POA: Diagnosis not present

## 2015-05-09 DIAGNOSIS — Z7982 Long term (current) use of aspirin: Secondary | ICD-10-CM | POA: Insufficient documentation

## 2015-05-09 DIAGNOSIS — I10 Essential (primary) hypertension: Secondary | ICD-10-CM | POA: Insufficient documentation

## 2015-05-09 DIAGNOSIS — M47819 Spondylosis without myelopathy or radiculopathy, site unspecified: Secondary | ICD-10-CM | POA: Insufficient documentation

## 2015-05-09 DIAGNOSIS — J159 Unspecified bacterial pneumonia: Secondary | ICD-10-CM | POA: Diagnosis not present

## 2015-05-09 DIAGNOSIS — K219 Gastro-esophageal reflux disease without esophagitis: Secondary | ICD-10-CM | POA: Insufficient documentation

## 2015-05-09 DIAGNOSIS — R4182 Altered mental status, unspecified: Secondary | ICD-10-CM | POA: Diagnosis not present

## 2015-05-09 DIAGNOSIS — Z86010 Personal history of colonic polyps: Secondary | ICD-10-CM | POA: Insufficient documentation

## 2015-05-09 DIAGNOSIS — G1221 Amyotrophic lateral sclerosis: Secondary | ICD-10-CM | POA: Insufficient documentation

## 2015-05-09 DIAGNOSIS — G931 Anoxic brain damage, not elsewhere classified: Secondary | ICD-10-CM

## 2015-05-09 DIAGNOSIS — Z79899 Other long term (current) drug therapy: Secondary | ICD-10-CM | POA: Diagnosis not present

## 2015-05-09 DIAGNOSIS — Z87891 Personal history of nicotine dependence: Secondary | ICD-10-CM | POA: Diagnosis not present

## 2015-05-09 DIAGNOSIS — Z8639 Personal history of other endocrine, nutritional and metabolic disease: Secondary | ICD-10-CM | POA: Insufficient documentation

## 2015-05-09 DIAGNOSIS — I469 Cardiac arrest, cause unspecified: Secondary | ICD-10-CM

## 2015-05-09 DIAGNOSIS — R0600 Dyspnea, unspecified: Secondary | ICD-10-CM | POA: Diagnosis present

## 2015-05-09 DIAGNOSIS — F419 Anxiety disorder, unspecified: Secondary | ICD-10-CM | POA: Insufficient documentation

## 2015-05-09 DIAGNOSIS — J189 Pneumonia, unspecified organism: Secondary | ICD-10-CM

## 2015-05-09 HISTORY — DX: Amyotrophic lateral sclerosis: G12.21

## 2015-05-09 LAB — CBC WITH DIFFERENTIAL/PLATELET
BASOS PCT: 0 %
Basophils Absolute: 0.1 10*3/uL (ref 0.0–0.1)
EOS PCT: 0 %
Eosinophils Absolute: 0.1 10*3/uL (ref 0.0–0.7)
HCT: 46.9 % (ref 39.0–52.0)
Hemoglobin: 14.8 g/dL (ref 13.0–17.0)
LYMPHS PCT: 16 %
Lymphs Abs: 2.3 10*3/uL (ref 0.7–4.0)
MCH: 30.1 pg (ref 26.0–34.0)
MCHC: 31.6 g/dL (ref 30.0–36.0)
MCV: 95.5 fL (ref 78.0–100.0)
MONO ABS: 1.1 10*3/uL — AB (ref 0.1–1.0)
Monocytes Relative: 8 %
Neutro Abs: 11.1 10*3/uL — ABNORMAL HIGH (ref 1.7–7.7)
Neutrophils Relative %: 76 %
Platelets: 375 10*3/uL (ref 150–400)
RBC: 4.91 MIL/uL (ref 4.22–5.81)
RDW: 14.6 % (ref 11.5–15.5)
WBC: 14.6 10*3/uL — AB (ref 4.0–10.5)

## 2015-05-09 LAB — CBG MONITORING, ED: GLUCOSE-CAPILLARY: 152 mg/dL — AB (ref 65–99)

## 2015-05-09 LAB — BRAIN NATRIURETIC PEPTIDE: B Natriuretic Peptide: 105.6 pg/mL — ABNORMAL HIGH (ref 0.0–100.0)

## 2015-05-09 LAB — I-STAT CG4 LACTIC ACID, ED: Lactic Acid, Venous: 1.48 mmol/L (ref 0.5–2.0)

## 2015-05-09 LAB — I-STAT TROPONIN, ED: TROPONIN I, POC: 0.03 ng/mL (ref 0.00–0.08)

## 2015-05-09 MED ORDER — VANCOMYCIN HCL IN DEXTROSE 1-5 GM/200ML-% IV SOLN
1000.0000 mg | Freq: Once | INTRAVENOUS | Status: AC
  Administered 2015-05-09: 1000 mg via INTRAVENOUS
  Filled 2015-05-09: qty 200

## 2015-05-09 MED ORDER — MORPHINE SULFATE (PF) 4 MG/ML IV SOLN
4.0000 mg | Freq: Once | INTRAVENOUS | Status: AC
  Administered 2015-05-09: 4 mg via INTRAVENOUS
  Filled 2015-05-09: qty 1

## 2015-05-09 MED ORDER — SODIUM BICARBONATE 8.4 % IV SOLN
50.0000 meq | Freq: Once | INTRAVENOUS | Status: AC
  Administered 2015-05-09: 50 meq via INTRAVENOUS
  Filled 2015-05-09: qty 50

## 2015-05-09 MED ORDER — PIPERACILLIN-TAZOBACTAM 3.375 G IVPB 30 MIN
3.3750 g | Freq: Once | INTRAVENOUS | Status: AC
  Administered 2015-05-09: 3.375 g via INTRAVENOUS
  Filled 2015-05-09: qty 50

## 2015-05-09 MED ORDER — SODIUM CHLORIDE 0.9 % IV BOLUS (SEPSIS)
1000.0000 mL | Freq: Once | INTRAVENOUS | Status: AC
  Administered 2015-05-09: 1000 mL via INTRAVENOUS

## 2015-05-21 IMAGING — CT CT CHEST W/ CM
2 of 4 series · 15 of 36 positions shown, 18 images · IV contrast (Omnipaque 300)
Comparison: 03/05/2014

CLINICAL DATA: Gastritis on recent endoscopy. Esophageal
dysmotility. Mild irregularity of the mid thoracic esophagus
esophagram dated 03/05/2014. Significant weight loss over the past
year.

EXAM:
CT CHEST WITH CONTRAST
TECHNIQUE: Multidetector CT imaging of the chest was performed during
intravenous contrast administration.
CONTRAST:  80mL OMNIPAQUE IOHEXOL 300 MG/ML  SOLN

[Series 2: chest routine with · axial · 0.74mm/px · z∈[-284,-28]mm · 12 of 61 slices shown, 15 images]
[im 5/61  mediastinal]
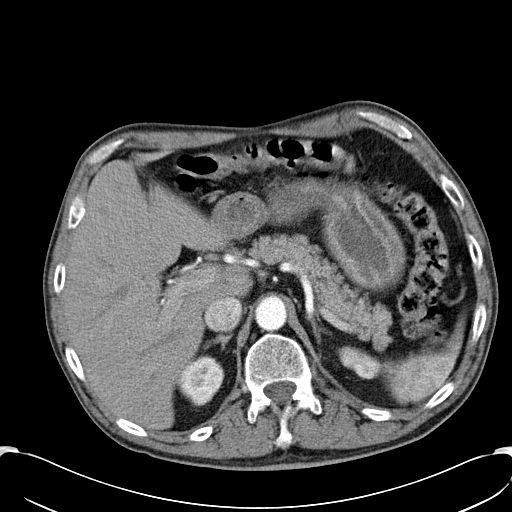
[im 5/61  lung]
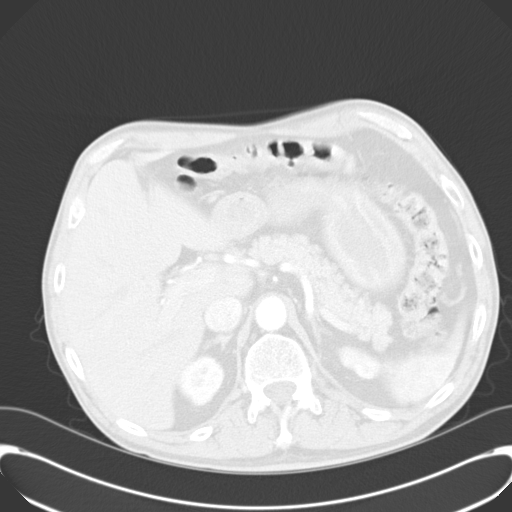
[im 10/61  lung]
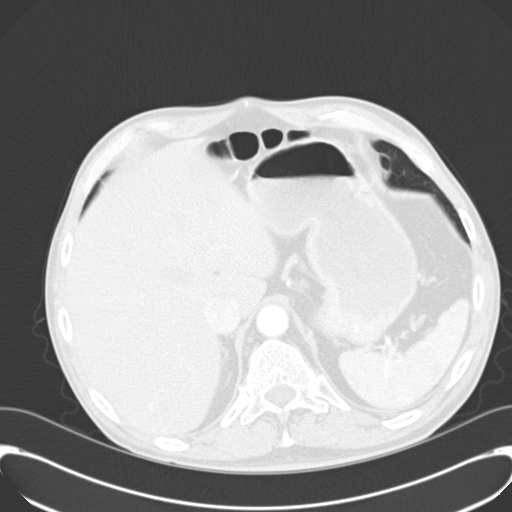
[im 14/61  lung]
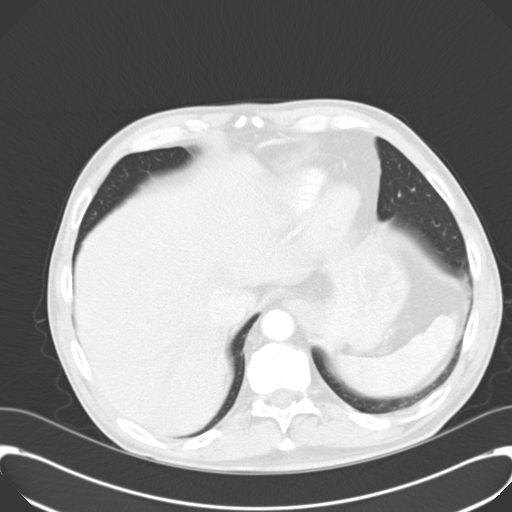
[im 19/61  lung]
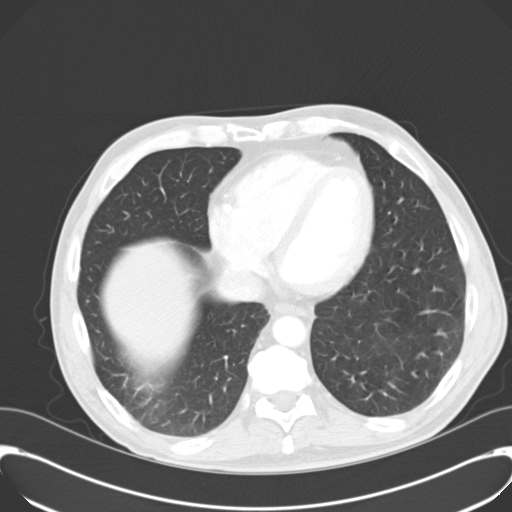
[im 24/61  mediastinal]
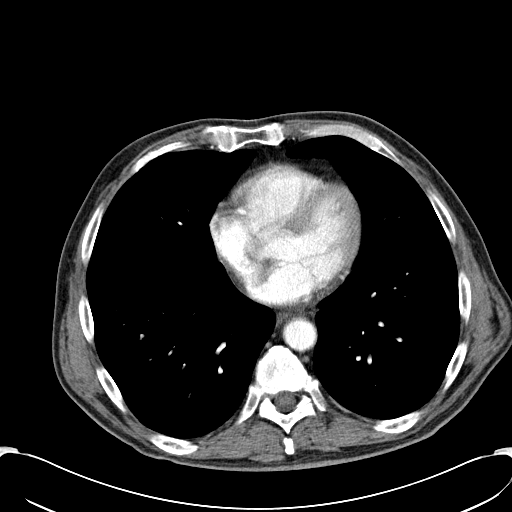
[im 24/61  lung]
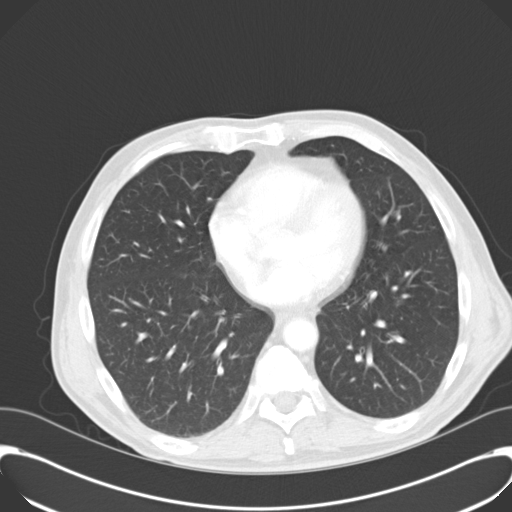
[im 28/61  lung]
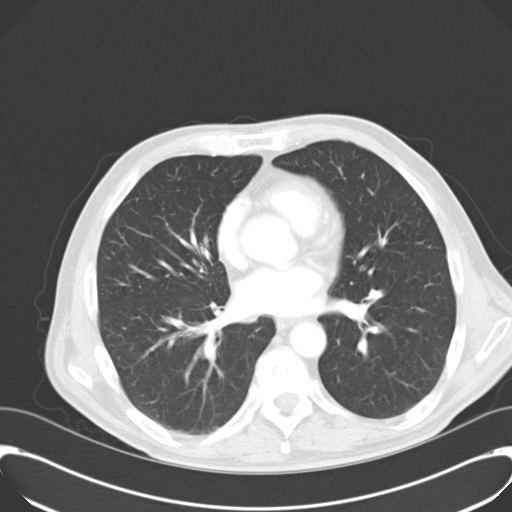
[im 33/61  lung]
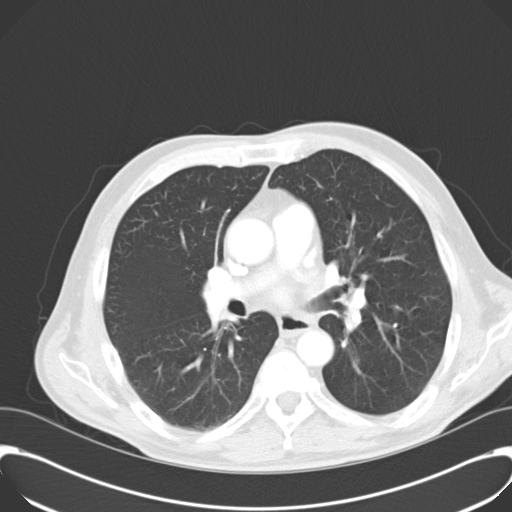
[im 37/61  lung]
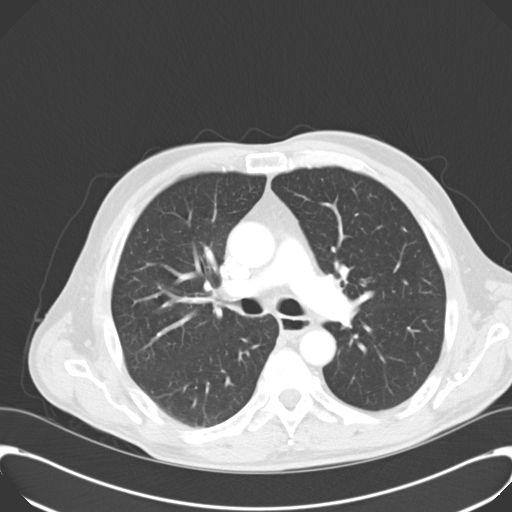
[im 42/61  mediastinal]
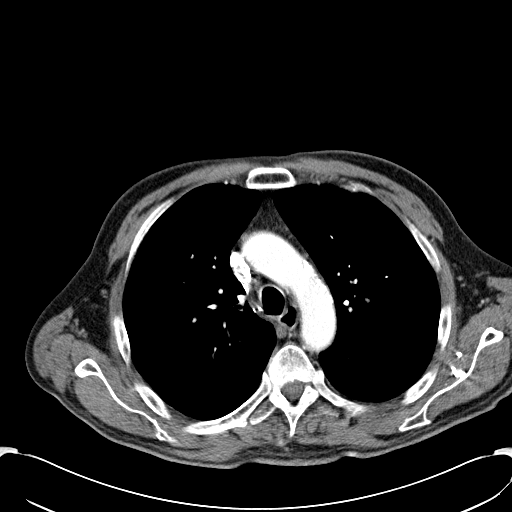
[im 42/61  lung]
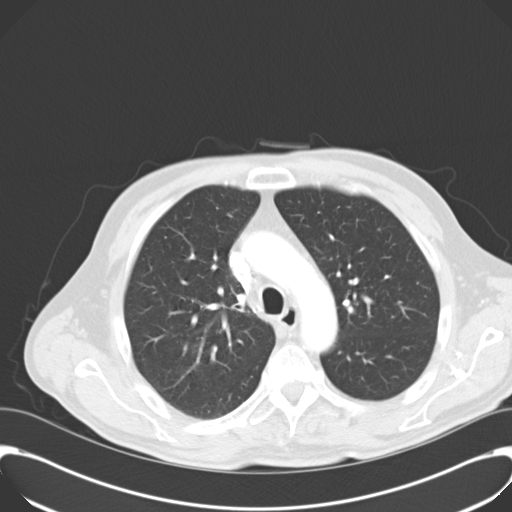
[im 47/61  lung]
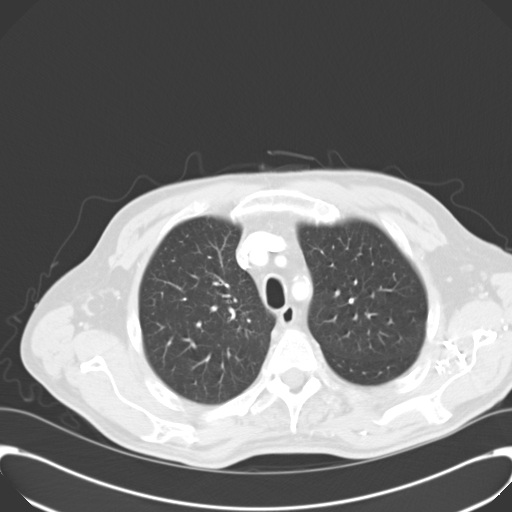
[im 51/61  lung]
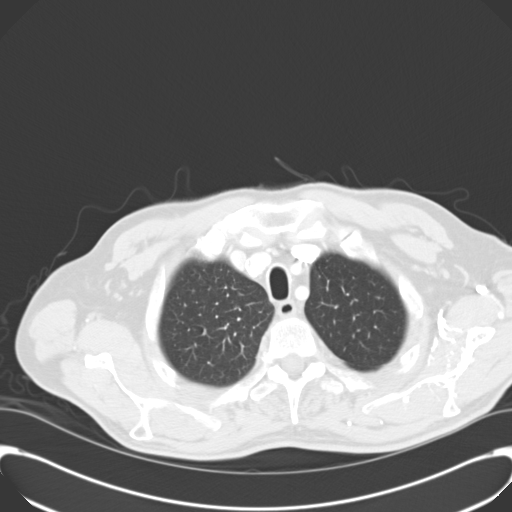
[im 56/61  lung]
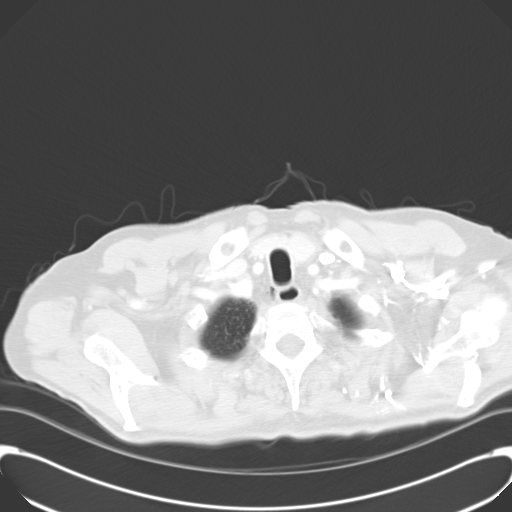

[Series 602: cor · coronal · 0.74mm/px · 3 of 115 slices shown]
[im 23/115  lung]
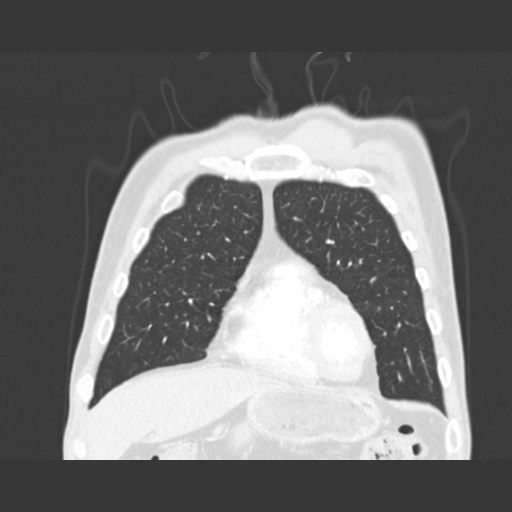
[im 46/115  lung]
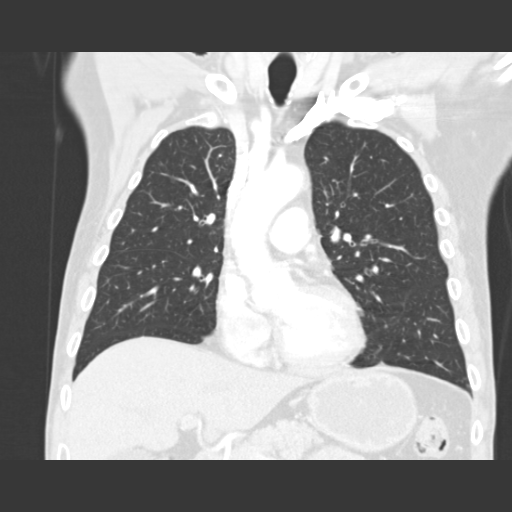
[im 69/115  lung]
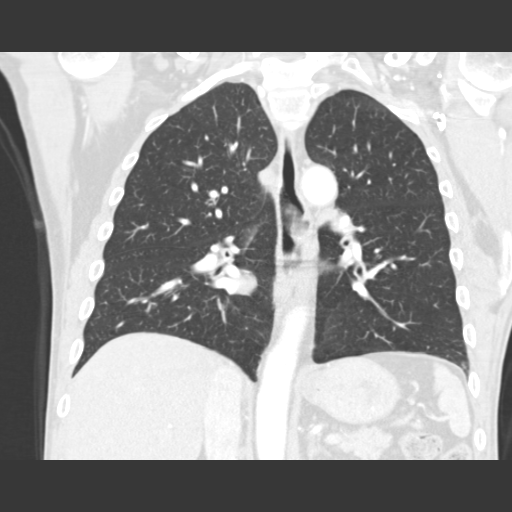

[15 of 36 positions shown; findings below may reference images not displayed]

FINDINGS: 0.9 by 0.7 cm focus of gas density adjacent to the upper thoracic
trachea and esophagus, potentially a tracheal diverticulum or tiny
amount of pneumomediastinum.

Mildly dilated esophagus. The indentation along the anterior
esophagus shown on prior exam is due to the left mainstem bronchus ;
no abnormal mass lesion in this vicinity. There is an air- fluid
level in the esophagus. Appearance suggests that esophageal
dysmotility.

No pathologic thoracic adenopathy. Atherosclerotic calcification of
the aortic arch noted.

On image 52 series 2 there is a 0.5 by 1.0 cm density in segment 7
of the liver which may represent flash filling of a hemangioma but
which is technically nonspecific.

Airway plugging noted in subsegmental airways in the left upper
lobe, images 28 through 32 of series 3, with some probable
associated small bronchiliths. No mass observed in this vicinity.
IMPRESSION: 1. The extrinsic filling defect along the anterior esophagus on
esophagram represents an indentation from the left mainstem bronchus
along the otherwise mildly dilated esophagus. There is no worrisome
mass in this vicinity.
2. A small gas density to the right of the upper thoracic trachea is
probably a tracheal diverticulum, less likely a tiny amount of
pneumomediastinum.
3. Atherosclerosis.
4. Nonspecific subcentimeter enhancing structure in segment 7 of the
liver, probably a flash filling hemangioma, but technically
nonspecific. If the patient has abnormal liver enzymes, history of
gastrointestinal malignancy, or if otherwise indicated, hepatic
protocol MRI with and without contrast would be the most specific
way to work this up by imaging.
5. Airway plugging potentially with small broncholiths in a left
upper lobe subsegmental bronchus.

## 2015-06-08 NOTE — ED Notes (Signed)
Oxygen saturations dropping to 60's on nrb.  Dr Darl Householder at bedside speaking with family about end-of-life.

## 2015-06-08 NOTE — ED Notes (Signed)
Time of death 73 called by Dr Darl Householder.

## 2015-06-08 NOTE — ED Notes (Signed)
Called CT.  They are ready for pt.

## 2015-06-08 NOTE — Progress Notes (Signed)
Palliative Medicine RN Note: Called by Dr Darl Householder to see Mr Bage in the ED. ALS, aspiration pneumonia, sats dropping to 80s, comfort care. PMT RN arrived in ED 15 minutes later, and sat had already dropped to 50s. Dr Darl Householder stated death was imminent and he did not plan to move the patient unless he stabilized in an hour or so. Wife, 3 children, and other family at bedside. Pt had no s/s distress, and he smiled when told his family was present and planned on taking good care of his kids and wife. He soon stopped breathing and became asystolic. Support provided to family by PMT RN and Shelley. Wife requested she be allowed to help remove EKG pads and put on gown, which was done with PMT RN. Provided contact info to ED nurse in case further questions or concerns arise. Larina Earthly, RN, BSN, [facility] 2015/06/08 11:14 AM Cell [phone] 8:00-4:00 Monday-Friday Office [phone]

## 2015-06-08 NOTE — Code Documentation (Signed)
59yo male arriving to [facility] via [place] at [phone].  Patient with ALS and recently hospitalized for pneumonia and underwent PEG placement at [facility].  Per family patient was up to the BR at 0400 and at his baseline.  Patient's wife reports leaving the house at 0600 to take her son to school and the patient seemed drowsy but was able to tell them goodbye.  On her return to the house he was not responding.  Code stroke called at 1001 by Dr. Darl Householder.  Stroke team to the bedside.  Patient nonverbal and not responding to voice.  Patient desaturating on nonrebreather - not stable for CT exam.  Dr. Silverio Decamp and Dr. Darl Householder to the bedside.  Patient's family at the bedside.  After discussion with family - CT and code stroke canceled per MDs.

## 2015-06-08 NOTE — Consult Note (Signed)
Requesting Physician: Dr.  Darl Householder    Reason for consultation: AMS  HPI:                                                                                                                                         Anthony Marquez is an 60 y.o. male patient who has been diagnosed with ALS in May 2016, recently had PEG tube placed one week ago at [facility]. This morning around 8:30 AM, his wife noted that he was poorly responsive, with altered mental status. He was brought into the ER for further evaluation. He is noted to be in significant respiratory distress with oxygen desaturation into the low 80s at presentation. He continued to have desats into 21s. Patient wife reported that he wished to be DO NOT INTUBATE status.    Past Medical History: Past Medical History  Diagnosis Date  . Arthritis     back  . Hypercholesterolemia   . Glaucoma   . Hypertension   . Anxiety   . Colon polyp   . GERD (gastroesophageal reflux disease)   . ALS (amyotrophic lateral sclerosis) [facility])     Past Surgical History  Procedure Laterality Date  . Right thumb  2006  . Colonoscopy    . Gastrectomy      Family History: Family History  Problem Relation Age of Onset  . Liver cancer Brother   . Colon cancer Neg Hx   . Pancreatic cancer Neg Hx   . Stomach cancer Neg Hx   . Esophageal cancer Neg Hx   . Gallbladder disease Neg Hx   . Liver disease Father   . Breast cancer Mother   . Heart disease Mother   . Diabetes Brother     Social History:   reports that he has quit smoking. His smoking use included Cigarettes. He has never used smokeless tobacco. He reports that he drinks about 3.6 oz of alcohol per week. He reports that he uses illicit drugs (Marijuana).  Allergies:  No Known Allergies   Medications:                                                                                                                         Current facility-administered medications:  .  piperacillin-tazobactam  (ZOSYN) IVPB 3.375 g, 3.375 g, Intravenous, Once, Para March, RPH, Last Rate: 100 mL/hr at 05-28-2015  1010, 3.375 g at 2015-05-10 1010 .  vancomycin (VANCOCIN) IVPB 1000 mg/200 mL premix, 1,000 mg, Intravenous, Once, Para March, RPH, Last Rate: 200 mL/hr at May 10, 2015 1010, 1,000 mg at May 10, 2015 1010  Current outpatient prescriptions:  .  ALPRAZolam (XANAX) 1 MG tablet, Take 1 mg by mouth at bedtime as needed for anxiety., Disp: , Rfl:  .  aspirin 81 MG tablet, Take 1 tablet (81 mg total) by mouth daily., Disp: 30 tablet, Rfl:  .  dorzolamide-timolol (COSOPT) 22.3-6.8 MG/ML ophthalmic solution, , Disp: , Rfl: 1 .  escitalopram (LEXAPRO) 20 MG tablet, Take 10 mg by mouth 2 (two) times daily., Disp: , Rfl:  .  HYDROcodone-acetaminophen (NORCO/VICODIN) 5-325 MG per tablet, Take 1 tablet by mouth as needed., Disp: , Rfl: 0 .  latanoprost (XALATAN) 0.005 % ophthalmic solution, , Disp: , Rfl: 2 .  levocetirizine (XYZAL) 5 MG tablet, Take 1 tablet by mouth daily., Disp: , Rfl: 0 .  Multiple Vitamins-Minerals (MULTIVITAMIN PO), Take 1 tablet by mouth daily. Pt takes Men's health One Day Vitamin, Disp: , Rfl:  .  omeprazole (PRILOSEC) 20 MG capsule, Take 20 mg by mouth daily., Disp: , Rfl:    ROS:                                                                                                                                       History  unobtainable from patient due to mental status   Neurologic Examination:                                                                                                      Blood pressure 196/110, pulse 85, temperature 97.5 F (36.4 C), temperature source Oral, resp. rate 22, SpO2 95 %. He is unresponsive, eyes open with no gaze deviation. Does not follow commands. His oxygen saturation was low 80s during initial evaluation and continued to decline to low 60s while on 100% nonrebreather mask.  No motor response to stimulation. No involuntary movements.      Lab Results: Basic Metabolic Panel: No results for input(s): NA, K, CL, CO2, GLUCOSE, BUN, CREATININE, CALCIUM, MG, PHOS in the last 168 hours.  Liver Function Tests: No results for input(s): AST, ALT, ALKPHOS, BILITOT, PROT, ALBUMIN in the last 168 hours. No results for input(s): LIPASE, AMYLASE in the last 168 hours. No results for input(s): AMMONIA in the last 168 hours.  CBC:  Recent Labs Lab 05-10-15 0915  WBC 14.6*  NEUTROABS 11.1*  HGB 14.8  HCT 46.9  MCV 95.5  PLT 375    Cardiac Enzymes: No results for input(s): CKTOTAL, CKMB, CKMBINDEX, TROPONINI in the last 168 hours.  Lipid Panel: No results for input(s): CHOL, TRIG, HDL, CHOLHDL, VLDL, LDLCALC in the last 168 hours.  CBG:  Recent Labs Lab 2015/06/08 0930  GLUCAP 152*    Microbiology: No results found for this or any previous visit.   Imaging: Dg Chest Port 1 View  08-Jun-2015  CLINICAL DATA:  Amyotrophic lateral sclerosis with difficulty breathing EXAM: PORTABLE CHEST 1 VIEW COMPARISON:  Chest radiograph July 02, 2011 ; chest CT March 16, 2014 FINDINGS: There is patchy airspace opacity in each lung base. Lungs elsewhere clear. Heart is upper normal in size with pulmonary vascularity within normal limits. No adenopathy. No bone lesions. IMPRESSION: Patchy airspace opacity in both lung bases. Question pneumonia or possibly aspiration in the lung bases, slightly more on the right than on the left. Lungs elsewhere clear. Heart upper normal in size. Electronically Signed   By: Lowella Grip III M.D.   On: 2015/06/08 09:51    Assessment and plan:   Anthony Marquez is an 60 y.o. male patient with ALS, and severely restricted distress with continued oxygen desaturation to low 60s, unresponsive. Likely terminal event due to respiratory failure. Discussed with wife at bedside.  We'll be available for any further questions.

## 2015-06-08 NOTE — ED Notes (Signed)
Family at bedside. 

## 2015-06-08 NOTE — ED Provider Notes (Signed)
CSN: [id number]     Arrival date & time 05-17-15  0907 History   First MD Initiated Contact with Patient 2015-05-17 0908     No chief complaint on file.    (Consider location/radiation/quality/duration/timing/severity/associated sxs/prior Treatment) The history is provided by the EMS personnel.  Anthony Marquez is a 60 y.o. male hx of ALS, HTN, peg tube placement, here presenting with altered mental status. Was recently admitted with pneumonia at [facility] about a week ago and then subsequently extubated. Patient had a PEG tube placed. He was also treated for aspiration pneumonia at the time. Patient is currently at home. Per EMS, patient was found to be less responsive than usual today. Patient lives at home with family. EMS unable to get a pulse ox on the patient and placed him on nonrebreather and his oxygen was 97% on nonrebreather.     Level V caveat- AMS    Past Medical History  Diagnosis Date  . Arthritis     back  . Hypercholesterolemia   . Glaucoma   . Hypertension   . Anxiety   . Colon polyp   . GERD (gastroesophageal reflux disease)    Past Surgical History  Procedure Laterality Date  . Right thumb  2006  . Colonoscopy    . Gastrectomy     Family History  Problem Relation Age of Onset  . Liver cancer Brother   . Colon cancer Neg Hx   . Pancreatic cancer Neg Hx   . Stomach cancer Neg Hx   . Esophageal cancer Neg Hx   . Gallbladder disease Neg Hx   . Liver disease Father   . Breast cancer Mother   . Heart disease Mother   . Diabetes Brother    Social History  Substance Use Topics  . Smoking status: Former Smoker    Types: Cigarettes  . Smokeless tobacco: Never Used  . Alcohol Use: 3.6 oz/week    6 Cans of beer per week     Comment: Occassionally    Review of Systems  Unable to perform ROS: Mental status change  All other systems reviewed and are negative.     Allergies  Review of patient's allergies indicates no known allergies.  Home Medications    Prior to Admission medications   Medication Sig Start Date End Date Taking? Authorizing Provider  ALPRAZolam Duanne Moron) 1 MG tablet Take 1 mg by mouth at bedtime as needed for anxiety.    Historical Provider, MD  aspirin 81 MG tablet Take 1 tablet (81 mg total) by mouth daily. 04/25/14   Penni Bombard, MD  dorzolamide-timolol (COSOPT) 22.3-6.8 MG/ML ophthalmic solution  12/25/13   Historical Provider, MD  escitalopram (LEXAPRO) 20 MG tablet Take 10 mg by mouth 2 (two) times daily.    Historical Provider, MD  HYDROcodone-acetaminophen (NORCO/VICODIN) 5-325 MG per tablet Take 1 tablet by mouth as needed. 01/27/14   Historical Provider, MD  latanoprost (XALATAN) 0.005 % ophthalmic solution  12/23/13   Historical Provider, MD  levocetirizine (XYZAL) 5 MG tablet Take 1 tablet by mouth daily. 12/15/13   Historical Provider, MD  Multiple Vitamins-Minerals (MULTIVITAMIN PO) Take 1 tablet by mouth daily. Pt takes Men's health One Day Vitamin    Historical Provider, MD  omeprazole (PRILOSEC) 20 MG capsule Take 20 mg by mouth daily.    Historical Provider, MD   BP 189/114 mmHg  Pulse 86  Temp(Src) 98.9 F (37.2 C) (Oral)  Resp 28  SpO2 97% Physical Exam  Constitutional:  Lethargic   HENT:  Head: Normocephalic.  Eyes: Conjunctivae are normal. Pupils are equal, round, and reactive to light.  Neck: Normal range of motion.  Cardiovascular: Normal rate, regular rhythm and normal heart sounds.   + JVD   Pulmonary/Chest: Effort normal.  Diminished bilateral bases   Abdominal: Bowel sounds are normal. He exhibits no distension. There is no tenderness. There is no rebound.  Musculoskeletal: Normal range of motion. He exhibits no edema or tenderness.  Neurological:  Lethargic, unresponsive to sternal rub   Skin: Skin is warm.  Psychiatric:  Unable   Nursing note and vitals reviewed.   ED Course  Procedures (including critical care time)  CRITICAL CARE Performed by: Darl Householder, DAVID   Total  critical care time: 30 minutes  Critical care time was exclusive of separately billable procedures and treating other patients.  Critical care was necessary to treat or prevent imminent or life-threatening deterioration.  Critical care was time spent personally by me on the following activities: development of treatment plan with patient and/or surrogate as well as nursing, discussions with consultants, evaluation of patient's response to treatment, examination of patient, obtaining history from patient or surrogate, ordering and performing treatments and interventions, ordering and review of laboratory studies, ordering and review of radiographic studies, pulse oximetry and re-evaluation of patient's condition.   Labs Review Labs Reviewed  CULTURE, BLOOD (ROUTINE X 2)  CULTURE, BLOOD (ROUTINE X 2)  CBC WITH DIFFERENTIAL/PLATELET  COMPREHENSIVE METABOLIC PANEL  BLOOD GAS, ARTERIAL  BRAIN NATRIURETIC PEPTIDE  I-STAT TROPOININ, ED  I-STAT CG4 LACTIC ACID, ED  CBG MONITORING, ED    Imaging Review No results found. I have personally reviewed and evaluated these images and lab results as part of my medical decision-making.   EKG Interpretation None      MDM   Final diagnoses:  None   Anthony Marquez is a 60 y.o. male hx of ALS here with AMS. Recently admitted for aspiration pneumonia. Concerned for another aspiration event. Will do sepsis workup.   9:56 AM Family came and said that he was normal 4 am and then became altered 6 am and stopped talking. Noticed R facial droop. Code stroke activated.   10:08 AM Neuro at bedside. Given recent peg tube and ALS, code stroke canceled. Family wants patient DNR. PH 6.8 with CO2 > 100. Patient is having shallow breathing and oxygen 80% on nonrebreather. Getting more pale and clammy. Consult palliative care. Patient is decompensating rapidly. Told family that he is dying.   10:39 AM Palliative care at bedside. Patient became more hypoxic  and stopped breathing and had no pulse at 10:35 am. Time of death 10:35 am. Family at bedside. No trauma so will not need medical examiner. Between PCP right now. I filled out death certificate.   Wandra Arthurs, MD 06/07/2015 1041

## 2015-06-08 NOTE — Progress Notes (Signed)
ABG Collected with pH 7.0, CO2 >100, the rest had "brackets" indicating it was a bad sample and needed to be recollected. A new sample collected by a different RT showed pH 6.86, CO2 >100 still showing "brackets" for the rest of the measurements. Both results given to ED MD. Pt made DNR at this time. RT will continue to monitor.

## 2015-06-08 NOTE — ED Notes (Signed)
Pt here from home with c/o resp distress, pt has ALS and was recently in hospital with PNA and peg tube placement

## 2015-06-08 DEATH — deceased
